# Patient Record
Sex: Male | Born: 1990 | Race: White | Hispanic: No | Marital: Married | Smoking: Current every day smoker
Health system: Southern US, Community
[De-identification: ages and names within clinical notes are randomized; demographics above are authoritative.]

## PROBLEM LIST (undated history)

## (undated) DIAGNOSIS — I10 Essential (primary) hypertension: Secondary | ICD-10-CM

---

## 2014-06-25 ENCOUNTER — Emergency Department: Payer: Self-pay | Admitting: Emergency Medicine

## 2014-06-25 LAB — COMPREHENSIVE METABOLIC PANEL
ALBUMIN: 3.5 g/dL (ref 3.4–5.0)
ANION GAP: 6 — AB (ref 7–16)
Alkaline Phosphatase: 92 U/L
BILIRUBIN TOTAL: 0.2 mg/dL (ref 0.2–1.0)
BUN: 17 mg/dL (ref 7–18)
CREATININE: 0.95 mg/dL (ref 0.60–1.30)
Calcium, Total: 9.1 mg/dL (ref 8.5–10.1)
Chloride: 108 mmol/L — ABNORMAL HIGH (ref 98–107)
Co2: 25 mmol/L (ref 21–32)
EGFR (African American): >60
EGFR (Non-African Amer.): >60
Glucose: 103 mg/dL — ABNORMAL HIGH (ref 65–99)
Osmolality: 279 (ref 275–301)
POTASSIUM: 4 mmol/L (ref 3.5–5.1)
SGOT(AST): 26 U/L (ref 15–37)
SGPT (ALT): 41 U/L (ref 12–78)
Sodium: 139 mmol/L (ref 136–145)
TOTAL PROTEIN: 7.3 g/dL (ref 6.4–8.2)

## 2014-06-25 LAB — CBC
HCT: 44.9 % (ref 40.0–52.0)
HGB: 14.8 g/dL (ref 13.0–18.0)
MCH: 28.7 pg (ref 26.0–34.0)
MCHC: 33.1 g/dL (ref 32.0–36.0)
MCV: 87 fL (ref 80–100)
PLATELETS: 175 10*3/uL (ref 150–440)
RBC: 5.18 10*6/uL (ref 4.40–5.90)
RDW: 13.7 % (ref 11.5–14.5)
WBC: 8.8 10*3/uL (ref 3.8–10.6)

## 2014-06-25 LAB — CK TOTAL AND CKMB (NOT AT ARMC)
CK, Total: 273 U/L
CK-MB: 1.8 ng/mL (ref 0.5–3.6)

## 2014-06-25 LAB — TROPONIN I

## 2015-11-18 ENCOUNTER — Emergency Department
Admission: EM | Admit: 2015-11-18 | Discharge: 2015-11-18 | Disposition: A | Discharge status: Home / Self Care | Payer: Self-pay | Attending: Emergency Medicine | Admitting: Emergency Medicine

## 2015-11-18 DIAGNOSIS — I10 Essential (primary) hypertension: Secondary | ICD-10-CM | POA: Insufficient documentation

## 2015-11-18 DIAGNOSIS — Z880 Allergy status to penicillin: Secondary | ICD-10-CM | POA: Insufficient documentation

## 2015-11-18 DIAGNOSIS — K219 Gastro-esophageal reflux disease without esophagitis: Secondary | ICD-10-CM | POA: Insufficient documentation

## 2015-11-18 DIAGNOSIS — F17200 Nicotine dependence, unspecified, uncomplicated: Secondary | ICD-10-CM | POA: Insufficient documentation

## 2015-11-18 DIAGNOSIS — R42 Dizziness and giddiness: Secondary | ICD-10-CM | POA: Insufficient documentation

## 2015-11-18 HISTORY — DX: Essential (primary) hypertension: I10

## 2015-11-18 LAB — BASIC METABOLIC PANEL
ANION GAP: 4 — AB (ref 5–15)
BUN: 17 mg/dL (ref 6–20)
CALCIUM: 9.9 mg/dL (ref 8.9–10.3)
CO2: 29 mmol/L (ref 22–32)
Chloride: 103 mmol/L (ref 101–111)
Creatinine, Ser: 1.02 mg/dL (ref 0.61–1.24)
Glucose, Bld: 111 mg/dL — ABNORMAL HIGH (ref 65–99)
POTASSIUM: 3.8 mmol/L (ref 3.5–5.1)
Sodium: 136 mmol/L (ref 135–145)

## 2015-11-18 LAB — CBC
HCT: 47.4 % (ref 40.0–52.0)
HEMOGLOBIN: 15.8 g/dL (ref 13.0–18.0)
MCH: 28.8 pg (ref 26.0–34.0)
MCHC: 33.3 g/dL (ref 32.0–36.0)
MCV: 86.3 fL (ref 80.0–100.0)
Platelets: 189 10*3/uL (ref 150–440)
RBC: 5.5 MIL/uL (ref 4.40–5.90)
RDW: 13.7 % (ref 11.5–14.5)
WBC: 8.5 10*3/uL (ref 3.8–10.6)

## 2015-11-18 LAB — URINALYSIS COMPLETE WITH MICROSCOPIC (ARMC ONLY)
Bacteria, UA: NONE SEEN
Bilirubin Urine: NEGATIVE
Glucose, UA: NEGATIVE mg/dL
HGB URINE DIPSTICK: NEGATIVE
KETONES UR: NEGATIVE mg/dL
LEUKOCYTES UA: NEGATIVE
NITRITE: NEGATIVE
PH: 6 (ref 5.0–8.0)
PROTEIN: 100 mg/dL — AB
SPECIFIC GRAVITY, URINE: 1.017 (ref 1.005–1.030)

## 2015-11-18 MED ORDER — PANTOPRAZOLE SODIUM 40 MG PO TBEC: 40.0000 mg | DELAYED_RELEASE_TABLET | Freq: Every day | ORAL | Status: AC

## 2015-11-18 NOTE — ED Notes (Signed)
Pt reports that he has been feeling dizzy and he is sure that it is his blood pressure. He states that he used to take bp meds, but his PCP took him off the meds because he "got better" after losing about 60 pounds. He reports that he has had swelling in his ankles when he wakes up but it goes away after an hour or two. Pt indicates hx of kidney problems in the past, wonders if that could be cause of dizziness and swelling.

## 2015-11-18 NOTE — ED Provider Notes (Signed)
[facility] Emergency Department Provider Note  Time seen: 9:42 AM  I have reviewed the triage vital signs and the nursing notes.   HISTORY  Chief Complaint Dizziness    HPI James Crawford is a 24 y.o. male with a past medical history of hypertension who presents to the emergency department with dizziness. According to the patient for the past several weeks he has been intermittently dizzy and lightheaded. States he had similar symptoms as a child when he is being treated for high blood pressure however he stopped taking this medication years ago. Patient also states occasionally he will have swelling in his hands and feet, but denies any currently. Also states at times he will have a burning feeling in his upper abdomen, especially upon awakening in the morning. But denies any currently. Denies any chest pain now or at any time. Denies any shortness of breath. Denies any focal weakness or numbness. Describes the dizziness as mild to moderate, but gone currently.Patient was concerned that his blood pressure could be high so he came to the emergency department for evaluation. During his laboratory draw the patient did have a brief syncopal episode.     Past Medical History  Diagnosis Date  . Hypertension     There are no active problems to display for this patient.   History reviewed. No pertinent past surgical history.  No current outpatient prescriptions on file.  Allergies Penicillins  History reviewed. No pertinent family history.  Social History Social History  Substance Use Topics  . Smoking status: Current Every Day Smoker  . Smokeless tobacco: None  . Alcohol Use: None    Review of Systems Constitutional: Negative for fever. Positive for intermittent dizziness Cardiovascular: Negative for chest pain. Respiratory: Negative for shortness of breath. Gastrointestinal: Occasional burning sensation in the epigastrium especially in the  mornings. Denies nausea, vomiting, diarrhea. Genitourinary: Negative for dysuria. Musculoskeletal: Negative for back pain. Neurological: Negative for focal weakness or numbness. 10-point ROS otherwise negative.  ____________________________________________   PHYSICAL EXAM:  VITAL SIGNS: ED Triage Vitals  Enc Vitals Group     BP 11/18/15 0837 144/83 mmHg     Pulse Rate 11/18/15 0837 79     Resp 11/18/15 0837 17     Temp 11/18/15 0837 98.2 F (36.8 C)     Temp Source 11/18/15 0837 Oral     SpO2 11/18/15 0837 95 %     Weight 11/18/15 0837 250 lb (113.399 kg)     Height 11/18/15 0837 5\' 11"  (1.803 m)     Head Cir --      Peak Flow --      Pain Score --      Pain Loc --      Pain Edu? --      Excl. in GC? --     Constitutional: Alert and oriented. Well appearing and in no distress. Eyes: Normal exam ENT   Head: Normocephalic and atraumatic.   Mouth/Throat: Mucous membranes are moist. Cardiovascular: Normal rate, regular rhythm. No murmur Respiratory: Normal respiratory effort without tachypnea nor retractions. Breath sounds are clear and equal bilaterally. No wheezes/rales/rhonchi. Gastrointestinal: Soft and nontender. No distention.  Musculoskeletal: Nontender with normal range of motion in all extremities. No lower extremity tenderness or edema. Neurologic:  Normal speech and language. No gross focal neurologic deficits  Skin:  Skin is warm, dry and intact.  Psychiatric: Mood and affect are normal. Speech and behavior are normal.   ____________________________________________    EKG  EKG reviewed and interpreted by myself shows normal sinus rhythm at 65 bpm, narrow QRS, normal axis, normal intervals, no ST changes. Overall normal EKG.  ____________________________________________    INITIAL IMPRESSION / ASSESSMENT AND PLAN / ED COURSE  Pertinent labs & imaging results that were available during my care of the patient were reviewed by me and considered in my  medical decision making (see chart for details).  Patient presents the emergency department with intermittent dizziness. Patient initially mildly hypertensive, however now his blood pressure is 120s/50s. Denies any symptoms at this time. During his laboratory draw the patient did have a brief syncopal episode. We will check labs, EKG, and closely monitor in the emergency department.  EKG within normal limits, blood work within normal limits. Currently awaiting urinalysis results. Patient continues to feel well. Patient's abdominal discomfort symptoms in the morning are most suggestive of gastritis/gastric reflux. We will treat the patient with a proton pump inhibitor.  Labs are within normal limits. We will discharge home with primary care follow-up. Patient is agreeable to plan.  ____________________________________________   FINAL CLINICAL IMPRESSION(S) / ED DIAGNOSES  Gastric reflux Dizziness   Minna Antis, MD 11/18/15 1151

## 2015-11-18 NOTE — Discharge Instructions (Signed)
Dizziness °Dizziness is a common problem. It makes you feel unsteady or lightheaded. You may feel like you are about to pass out (faint). Dizziness can lead to injury if you stumble or fall. Anyone can get dizzy, but dizziness is more common in older adults. This condition can be caused by a number of things, including: °· Medicines. °· Dehydration. °· Illness. °HOME CARE °Following these instructions may help with your condition: °Eating and Drinking °· Drink enough fluid to keep your pee (urine) clear or pale yellow. This helps to keep you from getting dehydrated. Try to drink more clear fluids, such as water. °· Do not drink alcohol. °· Limit how much caffeine you drink or eat if told by your doctor. °· Limit how much salt you drink or eat if told by your doctor. °Activity °· Avoid making quick movements. °¨ When you stand up from sitting in a chair, steady yourself until you feel okay. °¨ In the morning, first sit up on the side of the bed. When you feel okay, stand slowly while you hold onto something. Do this until you know that your balance is fine. °· Move your legs often if you need to stand in one place for a long time. Tighten and relax your muscles in your legs while you are standing. °· Do not drive or use heavy machinery if you feel dizzy. °· Avoid bending down if you feel dizzy. Place items in your home so that they are easy for you to reach without leaning over. °Lifestyle °· Do not use any tobacco products, including cigarettes, chewing tobacco, or electronic cigarettes. If you need help quitting, ask your doctor. °· Try to lower your stress level, such as with yoga or meditation. Talk with your doctor if you need help. °General Instructions °· Watch your dizziness for any changes. °· Take medicines only as told by your doctor. Talk with your doctor if you think that your dizziness is caused by a medicine that you are taking. °· Tell a friend or a family member that you are feeling dizzy. If he or  she notices any changes in your behavior, have this person call your doctor. °· Keep all follow-up visits as told by your doctor. This is important. °GET HELP IF: °· Your dizziness does not go away. °· Your dizziness or light-headedness gets worse. °· You feel sick to your stomach (nauseous). °· You have trouble hearing. °· You have new symptoms. °· You are unsteady on your feet or you feel like the room is spinning. °GET HELP RIGHT AWAY IF: °· You throw up (vomit) or have diarrhea and are unable to eat or drink anything. °· You have trouble: °¨ Talking. °¨ Walking. °¨ Swallowing. °¨ Using your arms, hands, or legs. °· You feel generally weak. °· You are not thinking clearly or you have trouble forming sentences. It may take a friend or family member to notice this. °· You have: °¨ Chest pain. °¨ Pain in your belly (abdomen). °¨ Shortness of breath. °¨ Sweating. °· Your vision changes. °· You are bleeding. °· You have a headache. °· You have neck pain or a stiff neck. °· You have a fever. °  °This information is not intended to replace advice given to you by your health care provider. Make sure you discuss any questions you have with your health care provider. °  °Document Released: 11/12/2011 Document Revised: 04/09/2015 Document Reviewed: 11/19/2014 °Elsevier Interactive Patient Education ©2016 Elsevier Inc. ° °

## 2015-11-18 NOTE — ED Notes (Signed)
Pt c/o feeling lightheaded/dizziness over the past several days.. States he is concerned it is b/p related.. States he had HTN as a child and these sx feel the same.. Pt does not currently take any medication for HTN.. States he did not check b/p at home or a local drug store..James Crawford

## 2017-10-29 ENCOUNTER — Emergency Department
Admission: EM | Admit: 2017-10-29 | Discharge: 2017-10-29 | Disposition: A | Discharge status: Home / Self Care | Payer: Medicaid Other | Attending: Emergency Medicine | Admitting: Emergency Medicine

## 2017-10-29 ENCOUNTER — Encounter: Payer: Self-pay | Admitting: Emergency Medicine

## 2017-10-29 ENCOUNTER — Other Ambulatory Visit: Payer: Self-pay

## 2017-10-29 DIAGNOSIS — F17200 Nicotine dependence, unspecified, uncomplicated: Secondary | ICD-10-CM | POA: Diagnosis not present

## 2017-10-29 DIAGNOSIS — K029 Dental caries, unspecified: Secondary | ICD-10-CM | POA: Diagnosis not present

## 2017-10-29 DIAGNOSIS — I10 Essential (primary) hypertension: Secondary | ICD-10-CM | POA: Insufficient documentation

## 2017-10-29 DIAGNOSIS — K0889 Other specified disorders of teeth and supporting structures: Secondary | ICD-10-CM | POA: Diagnosis present

## 2017-10-29 MED ORDER — CLINDAMYCIN HCL 150 MG PO CAPS: 300.0000 mg | ORAL_CAPSULE | Freq: Four times a day (QID) | ORAL | 0 refills | Status: DC

## 2017-10-29 MED ORDER — TRAMADOL HCL 50 MG PO TABS: 50.0000 mg | ORAL_TABLET | Freq: Four times a day (QID) | ORAL | 0 refills | Status: DC | PRN

## 2017-10-29 NOTE — ED Triage Notes (Signed)
Pt reports upper right jaw pain and swelling for two days. Ambulatory to triage, no apparent distress noted.

## 2017-10-29 NOTE — Discharge Instructions (Signed)
In taking antibiotics as directed. You may also take Tylenol over-the-counter as needed for dental pain. A list of dental clinics is listed on your discharge papers. Begin calling Monday for an appointment. Also the dental clinic at [facility] takes walk-ins and a list of these hours as added to your discharge instructions.  OPTIONS FOR DENTAL FOLLOW UP CARE   Department of Health and Human Services - Local Safety Net Dental Clinics TripDoors.comhttp://www.ncdhhs.gov/dph/oralhealth/services/safetynetclinics.htm   [facility] [phone])  [facility] [phone])  [facility] [phone])  [facility] [phone])  [facility] [phone]) This clinic caters to the indigent population and is on a lottery system. Location: [employer], [address], [address], [place] [facility] Hours: Wednesdays from 6pm - 9pm, patients seen by a lottery system. For dates, call or go to [email or web address] Services: Cleanings, fillings and simple extractions. Payment Options: DENTAL WORK IS FREE OF CHARGE. Bring proof of income or support. Best way to get seen: Arrive at 5:15 pm - this is a lottery, NOT first come/first serve, so arriving earlier will not increase your chances of being seen.     [facility] [phone] Select option 1 for emergencies   Location: [facility], [address], [address], [place] [facility] Hours: No walk-ins accepted - call the day before to schedule an appointment. Check in times are 9:30 am and 1:30 pm. Services: Simple extractions, temporary fillings, pulpectomy/pulp debridement, uncomplicated abscess drainage. Payment Options: PAYMENT IS DUE AT THE TIME OF SERVICE.  Fee is usually $100-200, additional surgical procedures (e.g. abscess drainage) may be extra. Cash, checks, Visa/MasterCard accepted.  Can  file Medicaid if patient is covered for dental - patient should call case worker to check. No discount for [facility] patients. Best way to get seen: MUST call the day before and get onto the schedule. Can usually be seen the next 1-2 days. No walk-ins accepted.     [facility] [phone]   Location: [facility], [address], [place] [facility] Hours: M, W, Th, F 8am or 1:30pm, Tues 9a or 1:30 - first come/first served. Services: Simple extractions, temporary fillings, uncomplicated abscess drainage.  You do not need to be an [place] resident. Payment Options: PAYMENT IS DUE AT THE TIME OF SERVICE. Dental insurance, otherwise sliding scale - bring proof of income or support. Depending on income and treatment needed, cost is usually $50-200. Best way to get seen: Arrive early as it is first come/first served.     [facility] [phone]   Location: [address] Clinic Hours: Mon-Thu 8a-5p Services: Most basic dental services including extractions and fillings. Payment Options: PAYMENT IS DUE AT THE TIME OF SERVICE. Sliding scale, up to 50% off - bring proof if income or support. Medicaid with dental option accepted. Best way to get seen: Call to schedule an appointment, can usually be seen within 2 weeks OR they will try to see walk-ins - show up at 8a or 2p (you may have to wait).     [facility] [phone] [place] RESIDENTS ONLY   Location: [facility], [address], [place], [place] [id number] Clinic Hours: By appointment only. Monday - Thursday 8am-5pm, Friday 8am-12pm Services: Cleanings, fillings, extractions. Payment Options: PAYMENT IS DUE AT THE TIME OF SERVICE. Cash, Visa or MasterCard. Sliding scale - $30 minimum per service. Best way  to get seen: Come in to office, complete packet and make an appointment - need proof  of income or support monies for each household member and proof of [facility] [place] residence. Usually takes about a month to get in.     [facility] [phone]   Location: [address] [facility] Hours: Walk-in Urgent Care Dental Services are offered Monday-Friday mornings only. The numbers of emergencies accepted daily is limited to the number of providers available. Maximum 15 - Mondays, Wednesdays & Thursdays Maximum 10 - Tuesdays & Fridays Services: You do not need to be a [place] [place] resident to be seen for a dental emergency. Emergencies are defined as pain, swelling, abnormal bleeding, or dental trauma. Walkins will receive x-rays if needed. NOTE: Dental cleaning is not an emergency. Payment Options: PAYMENT IS DUE AT THE TIME OF SERVICE. Minimum co-pay is $40.00 for uninsured patients. Minimum co-pay is $3.00 for Medicaid with dental coverage. Dental Insurance is accepted and must be presented at time of visit. Medicare does not cover dental. Forms of payment: Cash, credit card, checks. Best way to get seen: If not previously registered with the clinic, walk-in dental registration begins at 7:15 am and is on a first come/first serve basis. If previously registered with the clinic, call to make an appointment.     [facility] [phone] [place] RESIDENTS ONLY   Location: [address], [place], [place] Clinic Hours: Mon-Thu 10a-2p Services: Extractions only! Payment Options: FREE (donations accepted) - bring proof of income or support Best way to get seen: Call and schedule an appointment OR come at 8am on the 1st Monday of every month (except for holidays) when it is first come/first served.     [facility] [phone]   Location: [address], [place] Clinic Hours: Friday mornings Services, Payment Options, Best way to get seen: Call for info

## 2017-10-29 NOTE — ED Notes (Signed)
Pt complains of dental pain that started 3 days ago. Pt denies any previous tooth issues on Right side but knows of a "Bad tooth" on the left side. Pt face is swollen and pain is 5/10 right now. Awaiting EDP.

## 2017-10-29 NOTE — ED Provider Notes (Signed)
[facility] Emergency Department Provider Note   ____________________________________________   First MD Initiated Contact with Patient 10/29/17 1031     (approximate)  I have reviewed the triage vital signs and the nursing notes.   HISTORY  Chief Complaint Dental Pain   HPI James Crawford is a 26 y.o. male is here  with complaint of dental pain. Patient states for the last 2 days his right upper jaw has been painful and swollen. He has not taken any over-the-counter medication for this. He denies any fever or chills. He is not have a regular dentist.he rates his pain as 7 out of 10.   Past Medical History:  Diagnosis Date  . Hypertension     There are no active problems to display for this patient.   No past surgical history on file.  Prior to Admission medications   Medication Sig Start Date End Date Taking? Authorizing Provider  clindamycin (CLEOCIN) 150 MG capsule Take 2 capsules (300 mg total) by mouth 4 (four) times daily. 10/29/17   Tommi RumpsSummers, Ahijah Devery L, PA-C  pantoprazole (PROTONIX) 40 MG tablet Take 1 tablet (40 mg total) by mouth daily. 11/18/15 11/17/16  Minna AntisPaduchowski, Kevin, MD  traMADol (ULTRAM) 50 MG tablet Take 1 tablet (50 mg total) by mouth every 6 (six) hours as needed. 10/29/17   Tommi RumpsSummers, Waylynn Benefiel L, PA-C    Allergies Penicillins  No family history on file.  Social History Social History   Tobacco Use  . Smoking status: Current Every Day Smoker  Substance Use Topics  . Alcohol use: Not on file  . Drug use: Not on file    Review of Systems Constitutional: No fever/chills Eyes: No visual changes. ENT: No sore throat. positive for dental pain. Cardiovascular: Denies chest pain. Respiratory: Denies shortness of breath. Neurological: Negative for headaches, focal weakness or numbness. ____________________________________________   PHYSICAL EXAM:  VITAL SIGNS: ED Triage Vitals [10/29/17 1016]  Enc Vitals Group   BP (!) 157/74     Pulse Rate 63     Resp 18     Temp 98.2 F (36.8 C)     Temp Source Oral     SpO2 99 %     Weight (!) 330 lb (149.7 kg)     Height 6\' 2"  (1.88 m)     Head Circumference      Peak Flow      Pain Score 7     Pain Loc      Pain Edu?      Excl. in GC?    Constitutional: Alert and oriented. Well appearing and in no acute distress. Eyes: Conjunctivae are normal.  Head: Atraumatic. Mouth/Throat: Mucous membranes are moist.  Oropharynx non-erythematous.  Right upper gums premolar to  molar area are  tender and edematous. No active drainage for abscess located.  Teeth in poor hygiene and repair. Neck: No stridor.   Hematological/Lymphatic/Immunilogical: No cervical lymphadenopathy. Cardiovascular: Normal rate, regular rhythm. Grossly normal heart sounds.  Good peripheral circulation. Respiratory: Normal respiratory effort.  No retractions. Lungs CTAB. Musculoskeletal: moves upper and lower extremities without any difficulty. Normal gait was noted. Neurologic:  Normal speech and language. No gross focal neurologic deficits are appreciated.  Skin:  Skin is warm, dry and intact. Psychiatric: Mood and affect are normal. Speech and behavior are normal.  ____________________________________________   LABS (all labs ordered are listed, but only abnormal results are displayed)  Labs Reviewed - No data to display   PROCEDURES  Procedure(s) performed: None  Procedures  Critical Care performed: No  ____________________________________________   INITIAL IMPRESSION / ASSESSMENT AND PLAN / ED COURSE Patient was given a prescription for clindamycin and tramadol. He was also given a list of dental clinics in the area and encouraged to call to make an appointment or  take advantage of the walk-in clinic at [facility]. We discussed discontinuing smoking.  ____________________________________________   FINAL CLINICAL IMPRESSION(S) / ED DIAGNOSES  Final diagnoses:    Pain due to dental caries     ED Discharge Orders        Ordered    clindamycin (CLEOCIN) 150 MG capsule  4 times daily,   Status:  Discontinued     10/29/17 1054    clindamycin (CLEOCIN) 150 MG capsule  4 times daily     10/29/17 1108    traMADol (ULTRAM) 50 MG tablet  Every 6 hours PRN     10/29/17 1108       Note:  This document was prepared using Dragon voice recognition software and may include unintentional dictation errors.    Tommi RumpsSummers, Florice Hindle L, PA-C 10/29/17 1631    Emily FilbertWilliams, Jonathan E, MD 10/30/17 [phone]

## 2017-12-20 ENCOUNTER — Emergency Department: Payer: Self-pay

## 2017-12-20 ENCOUNTER — Encounter: Payer: Self-pay | Admitting: Emergency Medicine

## 2017-12-20 ENCOUNTER — Other Ambulatory Visit: Payer: Self-pay

## 2017-12-20 ENCOUNTER — Emergency Department
Admission: EM | Admit: 2017-12-20 | Discharge: 2017-12-20 | Disposition: A | Discharge status: Home / Self Care | Payer: Self-pay | Attending: Emergency Medicine | Admitting: Emergency Medicine

## 2017-12-20 DIAGNOSIS — I10 Essential (primary) hypertension: Secondary | ICD-10-CM | POA: Insufficient documentation

## 2017-12-20 DIAGNOSIS — J40 Bronchitis, not specified as acute or chronic: Secondary | ICD-10-CM

## 2017-12-20 DIAGNOSIS — J209 Acute bronchitis, unspecified: Secondary | ICD-10-CM | POA: Insufficient documentation

## 2017-12-20 DIAGNOSIS — F17200 Nicotine dependence, unspecified, uncomplicated: Secondary | ICD-10-CM | POA: Insufficient documentation

## 2017-12-20 MED ORDER — ALBUTEROL SULFATE HFA 108 (90 BASE) MCG/ACT IN AERS: 2.0000 | INHALATION_SPRAY | Freq: Four times a day (QID) | RESPIRATORY_TRACT | 0 refills | Status: DC | PRN

## 2017-12-20 MED ORDER — BENZONATATE 100 MG PO CAPS: 100.0000 mg | ORAL_CAPSULE | Freq: Three times a day (TID) | ORAL | 0 refills | Status: DC | PRN

## 2017-12-20 MED ORDER — PREDNISONE 10 MG PO TABS: ORAL_TABLET | ORAL | 0 refills | Status: DC

## 2017-12-20 MED ORDER — AZITHROMYCIN 250 MG PO TABS: ORAL_TABLET | ORAL | 0 refills | Status: DC

## 2017-12-20 MED ORDER — IPRATROPIUM-ALBUTEROL 0.5-2.5 (3) MG/3ML IN SOLN
3.0000 mL | Freq: Once | RESPIRATORY_TRACT | Status: AC
  Administered 2017-12-20: 3 mL via RESPIRATORY_TRACT
  Filled 2017-12-20: qty 3

## 2017-12-20 NOTE — ED Triage Notes (Signed)
Arrives with c/o coughing and wheezing x 2 days.  Denies fever.

## 2017-12-20 NOTE — ED Provider Notes (Signed)
[facility] Emergency Department Provider Note  ____________________________________________  Time seen: Approximately 12:37 PM  I have reviewed the triage vital signs and the nursing notes.   HISTORY  Chief Complaint Cough and Wheezing    HPI James Crawford is a 27 y.o. male that presents to the emergency department for evaluation of nasal congestion, productive cough with white sputum, wheezing for 2 days.  Wife thinks her husband had fever last night but did not have a thermometer.  Patient smokes a pack of cigarettes per day.  No sick contacts.  No sore throat, CP, nausea, vomiting, abdominal pain.  Past Medical History:  Diagnosis Date  . Hypertension     There are no active problems to display for this patient.   History reviewed. No pertinent surgical history.  Prior to Admission medications   Medication Sig Start Date End Date Taking? Authorizing Provider  albuterol (PROVENTIL HFA;VENTOLIN HFA) 108 (90 Base) MCG/ACT inhaler Inhale 2 puffs into the lungs every 6 (six) hours as needed for wheezing or shortness of breath. 12/20/17   Enid DerryWagner, Vernelle Wisner, PA-C  azithromycin (ZITHROMAX Z-PAK) 250 MG tablet Take 2 tablets (500 mg) on  Day 1,  followed by 1 tablet (250 mg) once daily on Days 2 through 5. 12/20/17   Enid DerryWagner, Carolos Fecher, PA-C  benzonatate (TESSALON PERLES) 100 MG capsule Take 1 capsule (100 mg total) by mouth 3 (three) times daily as needed for cough. 12/20/17 12/20/18  Enid DerryWagner, Lejend Dalby, PA-C  clindamycin (CLEOCIN) 150 MG capsule Take 2 capsules (300 mg total) by mouth 4 (four) times daily. 10/29/17   Tommi RumpsSummers, Rhonda L, PA-C  pantoprazole (PROTONIX) 40 MG tablet Take 1 tablet (40 mg total) by mouth daily. 11/18/15 11/17/16  Minna AntisPaduchowski, Kevin, MD  predniSONE (DELTASONE) 10 MG tablet Take 6 tablets on day 1, take 5 tablets on day 2, take 4 tablets on day 3, take 3 tablets on day 4, take 2 tablets on day 5, take 1 tablet on day 6 12/20/17   Enid DerryWagner, Yishai Rehfeld, PA-C   traMADol (ULTRAM) 50 MG tablet Take 1 tablet (50 mg total) by mouth every 6 (six) hours as needed. 10/29/17   Tommi RumpsSummers, Rhonda L, PA-C    Allergies Penicillins  No family history on file.  Social History Social History   Tobacco Use  . Smoking status: Current Every Day Smoker  . Smokeless tobacco: Never Used  Substance Use Topics  . Alcohol use: Not on file  . Drug use: Not on file     Review of Systems  Constitutional: No chills ENT: Positive for congestion and rhinorrhea. Cardiovascular: No chest pain. Respiratory: Positive for cough.  Gastrointestinal: No abdominal pain.  No nausea, no vomiting.   Musculoskeletal: Negative for musculoskeletal pain. Skin: Negative for rash, abrasions, lacerations, ecchymosis.   ____________________________________________   PHYSICAL EXAM:  VITAL SIGNS: ED Triage Vitals  Enc Vitals Group     BP 12/20/17 1146 (!) 160/94     Pulse Rate 12/20/17 1146 80     Resp 12/20/17 1146 16     Temp 12/20/17 1146 98.5 F (36.9 C)     Temp Source 12/20/17 1146 Oral     SpO2 12/20/17 1146 96 %     Weight 12/20/17 1145 (!) 340 lb (154.2 kg)     Height 12/20/17 1145 6\' 2"  (1.88 m)     Head Circumference --      Peak Flow --      Pain Score 12/20/17 1144 0     Pain  Loc --      Pain Edu? --      Excl. in GC? --      Constitutional: Alert and oriented. Well appearing and in no acute distress. Eyes: Conjunctivae are normal. PERRL. EOMI. No discharge. Head: Atraumatic. ENT: No frontal and maxillary sinus tenderness.      Ears: Tympanic membranes pearly gray. No discharge.      Nose: Mild congestion/rhinnorhea.      Mouth/Throat: Mucous membranes are moist. Oropharynx non-erythematous. Tonsils not enlarged. No exudates. Uvula midline. Neck: No stridor.   Hematological/Lymphatic/Immunilogical: No cervical lymphadenopathy. Cardiovascular: Normal rate, regular rhythm.  Good peripheral circulation. Respiratory: Normal respiratory effort without  tachypnea or retractions. Lungs CTAB. Good air entry to the bases with no decreased or absent breath sounds. Gastrointestinal: Bowel sounds 4 quadrants. Soft and nontender to palpation. No guarding or rigidity. No palpable masses. No distention. Musculoskeletal: Full range of motion to all extremities. No gross deformities appreciated. Neurologic:  Normal speech and language. No gross focal neurologic deficits are appreciated.  Skin:  Skin is warm, dry and intact. No rash noted.   ____________________________________________   LABS (all labs ordered are listed, but only abnormal results are displayed)  Labs Reviewed - No data to display ____________________________________________  EKG   ____________________________________________  RADIOLOGY Lexine Baton, personally viewed and evaluated these images (plain radiographs) as part of my medical decision making, as well as reviewing the written report by the radiologist.  Dg Chest 2 View  Result Date: 12/20/2017 CLINICAL DATA:  Coughing and wheezing for the past 2 days. EXAM: CHEST  2 VIEW COMPARISON:  None. FINDINGS: The heart size and mediastinal contours are within normal limits. Mild central peribronchial thickening. Both lungs are clear. The visualized skeletal structures are unremarkable. IMPRESSION: Mild bronchitic changes, which could reflect smoking or acute bronchitis. No consolidation. Electronically Signed   By: Obie Dredge M.D.   On: 12/20/2017 12:53    ____________________________________________    PROCEDURES  Procedure(s) performed:    Procedures    Medications  ipratropium-albuterol (DUONEB) 0.5-2.5 (3) MG/3ML nebulizer solution 3 mL (3 mLs Nebulization Given 12/20/17 1220)     ____________________________________________   INITIAL IMPRESSION / ASSESSMENT AND PLAN / ED COURSE  Pertinent labs & imaging results that were available during my care of the patient were reviewed by me and considered in  my medical decision making (see chart for details).  Review of the  [place] was performed in accordance of the [place] prior to dispensing any controlled drugs.     Patient's diagnosis is consistent with bronchitis. Vital signs and exam are reassuring.  Chest x-ray consistent with bronchitis.  Patient felt better after DuoNeb.  Patient appears well and is staying well hydrated.  Patient feels comfortable going home. Patient will be discharged home with prescriptions for azithromycin, prednisone, albuterol, Tessalon Perles. Patient is to follow up with PCP as needed or otherwise directed. Patient is given ED precautions to return to the ED for any worsening or new symptoms.     ____________________________________________  FINAL CLINICAL IMPRESSION(S) / ED DIAGNOSES  Final diagnoses:  Bronchitis      NEW MEDICATIONS STARTED DURING THIS VISIT:  ED Discharge Orders        Ordered    azithromycin (ZITHROMAX Z-PAK) 250 MG tablet     12/20/17 1306    predniSONE (DELTASONE) 10 MG tablet     12/20/17 1306    albuterol (PROVENTIL HFA;VENTOLIN HFA) 108 (90 Base) MCG/ACT inhaler  Every 6 hours  PRN     12/20/17 1306    benzonatate (TESSALON PERLES) 100 MG capsule  3 times daily PRN     12/20/17 1306          This chart was dictated using voice recognition software/Dragon. Despite best efforts to proofread, errors can occur which can change the meaning. Any change was purely unintentional.    Enid Derry, PA-C 12/20/17 1524    Jene Every, MD 12/20/17 1540

## 2018-03-21 ENCOUNTER — Other Ambulatory Visit: Payer: Self-pay

## 2018-03-21 ENCOUNTER — Emergency Department
Admission: EM | Admit: 2018-03-21 | Discharge: 2018-03-21 | Disposition: A | Discharge status: Home / Self Care | Payer: Self-pay | Attending: Emergency Medicine | Admitting: Emergency Medicine

## 2018-03-21 ENCOUNTER — Encounter: Payer: Self-pay | Admitting: Emergency Medicine

## 2018-03-21 DIAGNOSIS — I10 Essential (primary) hypertension: Secondary | ICD-10-CM | POA: Insufficient documentation

## 2018-03-21 DIAGNOSIS — J209 Acute bronchitis, unspecified: Secondary | ICD-10-CM | POA: Insufficient documentation

## 2018-03-21 DIAGNOSIS — F17210 Nicotine dependence, cigarettes, uncomplicated: Secondary | ICD-10-CM | POA: Insufficient documentation

## 2018-03-21 MED ORDER — IPRATROPIUM-ALBUTEROL 0.5-2.5 (3) MG/3ML IN SOLN
3.0000 mL | Freq: Once | RESPIRATORY_TRACT | Status: AC
  Administered 2018-03-21: 3 mL via RESPIRATORY_TRACT

## 2018-03-21 MED ORDER — ALBUTEROL SULFATE HFA 108 (90 BASE) MCG/ACT IN AERS: 2.0000 | INHALATION_SPRAY | Freq: Four times a day (QID) | RESPIRATORY_TRACT | 0 refills | Status: AC | PRN

## 2018-03-21 MED ORDER — IPRATROPIUM-ALBUTEROL 0.5-2.5 (3) MG/3ML IN SOLN
RESPIRATORY_TRACT | Status: AC
  Administered 2018-03-21: 3 mL via RESPIRATORY_TRACT
  Filled 2018-03-21: qty 3

## 2018-03-21 NOTE — ED Provider Notes (Signed)
[facility] Emergency Department Provider Note   ____________________________________________   First MD Initiated Contact with Patient 03/21/18 1142     (approximate)  I have reviewed the triage vital signs and the nursing notes.   HISTORY  Chief Complaint Cough   HPI James Crawford is a 27 y.o. male is here with complaint of intermittent cough for several days.  Patient states that he usually gets bronchitis approximately 3 times a year and when he gets a breathing treatment he gets relief.  He denies any fever or chills.  He has had a nonproductive cough.  Patient has been out in pollen which he attributes to this.  Patient does continue to smoke.  Past Medical History:  Diagnosis Date  . Hypertension     There are no active problems to display for this patient.   History reviewed. No pertinent surgical history.  Prior to Admission medications   Medication Sig Start Date End Date Taking? Authorizing Provider  albuterol (PROVENTIL HFA;VENTOLIN HFA) 108 (90 Base) MCG/ACT inhaler Inhale 2 puffs into the lungs every 6 (six) hours as needed for wheezing or shortness of breath. 03/21/18   Tommi Rumps, PA-C  pantoprazole (PROTONIX) 40 MG tablet Take 1 tablet (40 mg total) by mouth daily. 11/18/15 11/17/16  Minna Antis, MD    Allergies Penicillins  No family history on file.  Social History Social History   Tobacco Use  . Smoking status: Current Every Day Smoker  . Smokeless tobacco: Never Used  Substance Use Topics  . Alcohol use: Not on file  . Drug use: Not on file    Review of Systems Constitutional: No fever/chills Eyes: No visual changes. ENT: No sore throat.  Positive mild nasal congestion. Cardiovascular: Denies chest pain. Respiratory: Denies shortness of breath.  Positive cough. Musculoskeletal: Negative for back pain. Neurological: Negative for headaches. ____________________________________________   PHYSICAL  EXAM:  VITAL SIGNS: ED Triage Vitals  Enc Vitals Group     BP 03/21/18 1112 (!) 154/76     Pulse --      Resp 03/21/18 1112 18     Temp 03/21/18 1112 98.7 F (37.1 C)     Temp Source 03/21/18 1112 Oral     SpO2 03/21/18 1112 98 %     Weight 03/21/18 1114 (!) 340 lb (154.2 kg)     Height 03/21/18 1114 6\' 2"  (1.88 m)     Head Circumference --      Peak Flow --      Pain Score --      Pain Loc --      Pain Edu? --      Excl. in GC? --    Constitutional: Alert and oriented. Well appearing and in no acute distress. Eyes: Conjunctivae are normal.  Head: Atraumatic. Nose: Mild congestion/no rhinnorhea.  TMs are dull but no erythema or injection seen. Mouth/Throat: Mucous membranes are moist.  Oropharynx non-erythematous.  Minimal posterior drainage. Neck: No stridor.   Hematological/Lymphatic/Immunilogical: No cervical lymphadenopathy. Cardiovascular: Normal rate, regular rhythm. Grossly normal heart sounds.  Good peripheral circulation. Respiratory: Normal respiratory effort.  No retractions. Lungs CTAB.  Patient is status post 1 nebulizer treatment at this time. Gastrointestinal: Soft and nontender. No distention.  Musculoskeletal: His upper and lower extremities without any difficulty.  Normal gait was noted. Neurologic:  Normal speech and language. No gross focal neurologic deficits are appreciated.  Skin:  Skin is warm, dry and intact. Psychiatric: Mood and affect are normal. Speech and  behavior are normal.  ____________________________________________   LABS (all labs ordered are listed, but only abnormal results are displayed)  Labs Reviewed - No data to display   PROCEDURES  Procedure(s) performed: None  Procedures  Critical Care performed: No  ____________________________________________   INITIAL IMPRESSION / ASSESSMENT AND PLAN / ED COURSE  As part of my medical decision making, I reviewed the following data within the electronic MEDICAL RECORD NUMBER Notes  from prior ED visits and [place] Controlled Substance Database  Patient presents with complaint of cough and history of bronchitis.  He is improved after one nebulizer treatment.  Patient is encouraged to discontinue smoking.  He was given a prescription for albuterol inhaler and encouraged to obtain either Zyrtec or Claritin for allergy symptoms.  He will increase fluids.  Information for follow-up was given to him either [facility] or [facility]  ____________________________________________   FINAL CLINICAL IMPRESSION(S) / ED DIAGNOSES  Final diagnoses:  Acute bronchitis, unspecified organism  Cigarette smoker     ED Discharge Orders        Ordered    albuterol (PROVENTIL HFA;VENTOLIN HFA) 108 (90 Base) MCG/ACT inhaler  Every 6 hours PRN     03/21/18 1200       Note:  This document was prepared using Dragon voice recognition software and may include unintentional dictation errors.    Tommi RumpsSummers, Aydian Dimmick L, PA-C 03/21/18 1205    Jeanmarie PlantMcShane, James A, MD 03/21/18 1447

## 2018-03-21 NOTE — ED Triage Notes (Signed)
Pt states intermittent cough for the past few days, states he gets bronchitis about 3 times a year-asking for breathing treatment, states they always help, has been around a lot of pollen the past few days.

## 2018-03-21 NOTE — ED Notes (Signed)
See triage note  Presents with cough for couple of days  Hx of bronchitis   States the sx's have been worse with all the pollen  Pos. Wheezing noted  Was given SVN treatment in triage  States he feels better

## 2018-03-21 NOTE — Discharge Instructions (Addendum)
Follow-[facility] or [facility]  Begin using albuterol inhaler 2 puffs 4 times a day as needed.  Discontinue smoking.  Increase fluids.

## 2018-03-21 NOTE — ED Notes (Signed)
First Nurse Note:  Patient complaining of SHOB.  Alert and oriented.  Color good.  Speaking in full sentences.  Denies hx of asthma.

## 2018-09-17 ENCOUNTER — Other Ambulatory Visit: Payer: Self-pay

## 2018-09-17 ENCOUNTER — Encounter: Payer: Self-pay | Admitting: Emergency Medicine

## 2018-09-17 ENCOUNTER — Emergency Department
Admission: EM | Admit: 2018-09-17 | Discharge: 2018-09-17 | Disposition: A | Discharge status: Home / Self Care | Payer: Medicaid Other | Attending: Emergency Medicine | Admitting: Emergency Medicine

## 2018-09-17 ENCOUNTER — Emergency Department: Payer: Medicaid Other

## 2018-09-17 DIAGNOSIS — I10 Essential (primary) hypertension: Secondary | ICD-10-CM | POA: Insufficient documentation

## 2018-09-17 DIAGNOSIS — Z720 Tobacco use: Secondary | ICD-10-CM

## 2018-09-17 DIAGNOSIS — F17210 Nicotine dependence, cigarettes, uncomplicated: Secondary | ICD-10-CM | POA: Insufficient documentation

## 2018-09-17 DIAGNOSIS — R002 Palpitations: Secondary | ICD-10-CM | POA: Insufficient documentation

## 2018-09-17 DIAGNOSIS — Z79899 Other long term (current) drug therapy: Secondary | ICD-10-CM | POA: Insufficient documentation

## 2018-09-17 LAB — COMPREHENSIVE METABOLIC PANEL
ALK PHOS: 70 U/L (ref 38–126)
ALT: 51 U/L — AB (ref 0–44)
AST: 31 U/L (ref 15–41)
Albumin: 4.2 g/dL (ref 3.5–5.0)
Anion gap: 9 (ref 5–15)
BUN: 13 mg/dL (ref 6–20)
CALCIUM: 9.4 mg/dL (ref 8.9–10.3)
CO2: 25 mmol/L (ref 22–32)
CREATININE: 1.02 mg/dL (ref 0.61–1.24)
Chloride: 104 mmol/L (ref 98–111)
GFR calc non Af Amer: >60 mL/min (ref >60)
Glucose, Bld: 108 mg/dL — ABNORMAL HIGH (ref 70–99)
Potassium: 3.7 mmol/L (ref 3.5–5.1)
SODIUM: 138 mmol/L (ref 135–145)
Total Bilirubin: 0.6 mg/dL (ref 0.3–1.2)
Total Protein: 7.4 g/dL (ref 6.5–8.1)

## 2018-09-17 LAB — CBC
HCT: 47.4 % (ref 39.0–52.0)
HEMOGLOBIN: 15.9 g/dL (ref 13.0–17.0)
MCH: 29.2 pg (ref 26.0–34.0)
MCHC: 33.5 g/dL (ref 30.0–36.0)
MCV: 87.1 fL (ref 80.0–100.0)
Platelets: 195 10*3/uL (ref 150–400)
RBC: 5.44 MIL/uL (ref 4.22–5.81)
RDW: 13.2 % (ref 11.5–15.5)
WBC: 11.3 10*3/uL — ABNORMAL HIGH (ref 4.0–10.5)
nRBC: 0 % (ref 0.0–0.2)

## 2018-09-17 LAB — TROPONIN I: Troponin I: <0.03 ng/mL (ref <0.03)

## 2018-09-17 NOTE — ED Triage Notes (Signed)
States approx 1 hour ago had L chest pain radiating down L arm. Lasted about 30 minutes. States is relieved now. Denies SOB. Denies fall or injury.

## 2018-09-17 NOTE — ED Provider Notes (Signed)
[facility] Emergency Department Provider Note  ____________________________________________   I have reviewed the triage vital signs and the nursing notes. Where available I have reviewed prior notes and, if possible and indicated, outside hospital notes.    HISTORY  Chief Complaint Chest Pain    HPI Seann Genther is a 27 y.o. male with a history he states of hypertension presents today complaining of "I feel fine".  Patient would like to go home.  He states that he drank a Public librarian drink, had a fluttering sensation for a few minutes and his chest, without any chest pain or shortness of breath, and then he had a discomfort that was in the ulnar aspect of his left wrist of the last for a few minutes as well.  He denies pain shooting down his arm.  It was a very circumscribed area of discomfort about 3 or 4 inches, very narrow, over the left wrist.  No numbness no tingling, no chest pain no shortness of breath, no leg pain no leg swelling no personal family history of PE or DVT, no cough, no diaphoresis, no nausea, no leg swelling, no recent surgery or travel.  Patient states that he feels 100% fine, he thinks he just had a weird reaction to the monster drink and he wants to go home.  He is very disinclined to stay for serial enzymes but he does allow me to keep him here until the first when comes back.    Past Medical History:  Diagnosis Date  . Hypertension     There are no active problems to display for this patient.   History reviewed. No pertinent surgical history.  Prior to Admission medications   Medication Sig Start Date End Date Taking? Authorizing Provider  albuterol (PROVENTIL HFA;VENTOLIN HFA) 108 (90 Base) MCG/ACT inhaler Inhale 2 puffs into the lungs every 6 (six) hours as needed for wheezing or shortness of breath. 03/21/18   Tommi Rumps, PA-C  pantoprazole (PROTONIX) 40 MG tablet Take 1 tablet (40 mg total) by mouth daily. 11/18/15  11/17/16  Minna Antis, MD    Allergies Penicillins  No family history on file.  Social History Social History   Tobacco Use  . Smoking status: Current Every Day Smoker    Packs/day: 1.00    Types: Cigarettes  . Smokeless tobacco: Never Used  Substance Use Topics  . Alcohol use: Not on file  . Drug use: Not on file    Review of Systems Constitutional: No fever/chills Eyes: No visual changes. ENT: No sore throat. No stiff neck no neck pain Cardiovascular: Denies chest pain. Respiratory: Denies shortness of breath. Gastrointestinal:   no vomiting.  No diarrhea.  No constipation. Genitourinary: Negative for dysuria. Musculoskeletal: Negative lower extremity swelling Skin: Negative for rash. Neurological: Negative for severe headaches, focal weakness or numbness.   ____________________________________________   PHYSICAL EXAM:  VITAL SIGNS: ED Triage Vitals  Enc Vitals Group     BP 09/17/18 1616 (!) 163/90     Pulse Rate 09/17/18 1616 79     Resp 09/17/18 1616 18     Temp 09/17/18 1616 98.1 F (36.7 C)     Temp Source 09/17/18 1616 Oral     SpO2 09/17/18 1616 98 %     Weight 09/17/18 1631 (!) 330 lb (149.7 kg)     Height 09/17/18 1631 6\' 2"  (1.88 m)     Head Circumference --      Peak Flow --  Pain Score 09/17/18 1631 0     Pain Loc --      Pain Edu? --      Excl. in GC? --     Constitutional: Alert and oriented. Well appearing and in no acute distress. Eyes: Conjunctivae are normal Head: Atraumatic HEENT: No congestion/rhinnorhea. Mucous membranes are moist.  Oropharynx non-erythematous Neck:   Nontender with no meningismus, no masses, no stridor Cardiovascular: Normal rate, regular rhythm. Grossly normal heart sounds.  Good peripheral circulation. Respiratory: Normal respiratory effort.  No retractions. Lungs CTAB. Abdominal: Soft and nontender. No distention. No guarding no rebound Back:  There is no focal tenderness or step off.  there is no  midline tenderness there are no lesions noted. there is no CVA tenderness Musculoskeletal: No lower extremity tenderness, no upper extremity tenderness. No joint effusions, no DVT signs strong distal pulses no edema Neurologic:  Normal speech and language. No gross focal neurologic deficits are appreciated.  Skin:  Skin is warm, dry and intact. No rash noted. Psychiatric: Mood and affect are normal. Speech and behavior are normal.  ____________________________________________   LABS (all labs ordered are listed, but only abnormal results are displayed)  Labs Reviewed  CBC - Abnormal; Notable for the following components:      Result Value   WBC 11.3 (*)    All other components within normal limits  COMPREHENSIVE METABOLIC PANEL - Abnormal; Notable for the following components:   Glucose, Bld 108 (*)    ALT 51 (*)    All other components within normal limits  TROPONIN I    Pertinent labs  results that were available during my care of the patient were reviewed by me and considered in my medical decision making (see chart for details). ____________________________________________  EKG  I personally interpreted any EKGs ordered by me or triage Sinus rhythm rate 76 bpm no acute ST elevation or depression, normal axis, nonspecific EKG just no acute ischemia  ____________________________________________  RADIOLOGY  Pertinent labs & imaging results that were available during my care of the patient were reviewed by me and considered in my medical decision making (see chart for details). If possible, patient and/or family made aware of any abnormal findings.  Dg Chest 2 View  Result Date: 09/17/2018 CLINICAL DATA:  Chest pain.  Hypertension.  Smoker. EXAM: CHEST - 2 VIEW COMPARISON:  12/20/2017 FINDINGS: Midline trachea.  Normal heart size and mediastinal contours. Sharp costophrenic angles.  No pneumothorax.  Clear lungs. Mild hyperinflation. IMPRESSION: No active cardiopulmonary  disease. Electronically Signed   By: Jeronimo Greaves M.D.   On: 09/17/2018 17:49   ____________________________________________    PROCEDURES  Procedure(s) performed: None  Procedures  Critical Care performed: None  ____________________________________________   INITIAL IMPRESSION / ASSESSMENT AND PLAN / ED COURSE  Pertinent labs & imaging results that were available during my care of the patient were reviewed by me and considered in my medical decision making (see chart for details).  Patient here with a feeling of palpitations was very fleeting after taking large dose of caffeine, and then he noticed the discomfort in the very circumscribed area of his wrist, which is completely gone.  He has no ongoing symptoms.  This is a very atypical story for ACS.  He is a smoker.  I have strongly advised that he stay for serial enzymes and he refuses.  He states his kids are at home.  He and I have my customary discussion, he understands and without further work-up, I cannot  rule out ACS PE dissection or other significant illness although I have very low suspicion for all those entities.  Patient states that he is totally fine without and he wants to go home.  He does have medical decision-making capacity I believe.  His wife is with him and she agrees that they need to get home and take care of their kids.  Given that he refuses further work-up is a very atypical story and has a negative work-up thus far we will discharge him.  However, I did make it very clear to the patient when that he should stop smoking.  I did spend 6 minutes with him on smoking cessation, and I also strongly advised that he stay for further work-up which he declined, and further, I did advise that if he changes his mind or feel worse he is to come back to the emergency room and he understands that.    ____________________________________________   FINAL CLINICAL IMPRESSION(S) / ED DIAGNOSES  Final diagnoses:  None       This chart was dictated using voice recognition software.  Despite best efforts to proofread,  errors can occur which can change meaning.      Jeanmarie Plant, MD 09/17/18 [phone]

## 2018-09-17 NOTE — ED Triage Notes (Signed)
Pt arrives via ems reports some pain to the left side of his chest and some pain in his left forearm, pt reports just sitting, states that he had just finished a monster drink, no distress noted at this time

## 2018-09-17 NOTE — Discharge Instructions (Addendum)
It is not clear exactly what caused her symptoms today.  We have recommended that you stay in the hospital for another couple hours for [place] to figure it out but you need to go home which is certainly your choice but as we indicated it does limit our ability to further evaluate your heart.  For this reason we asked that you be very vigilant with your symptoms and if you have chest pain or shortness of breath or feel worse in any way that you return to the emergency department.  I further advise that you up smoking and follow closely with cardiology and the referral physicians above.  We are always happy to take care of you if you change your mind or feel worse.

## 2019-03-18 IMAGING — CR DG CHEST 2V
1 series · 2 of 2 positions shown · non-contrast
Comparison: None.

CLINICAL DATA: Coughing and wheezing for the past 2 days.

EXAM:
CHEST  2 VIEW

[Series 1: dg chest 2 view · 0.14mm/px · 2 of 2 slices shown]
[im 1/2]
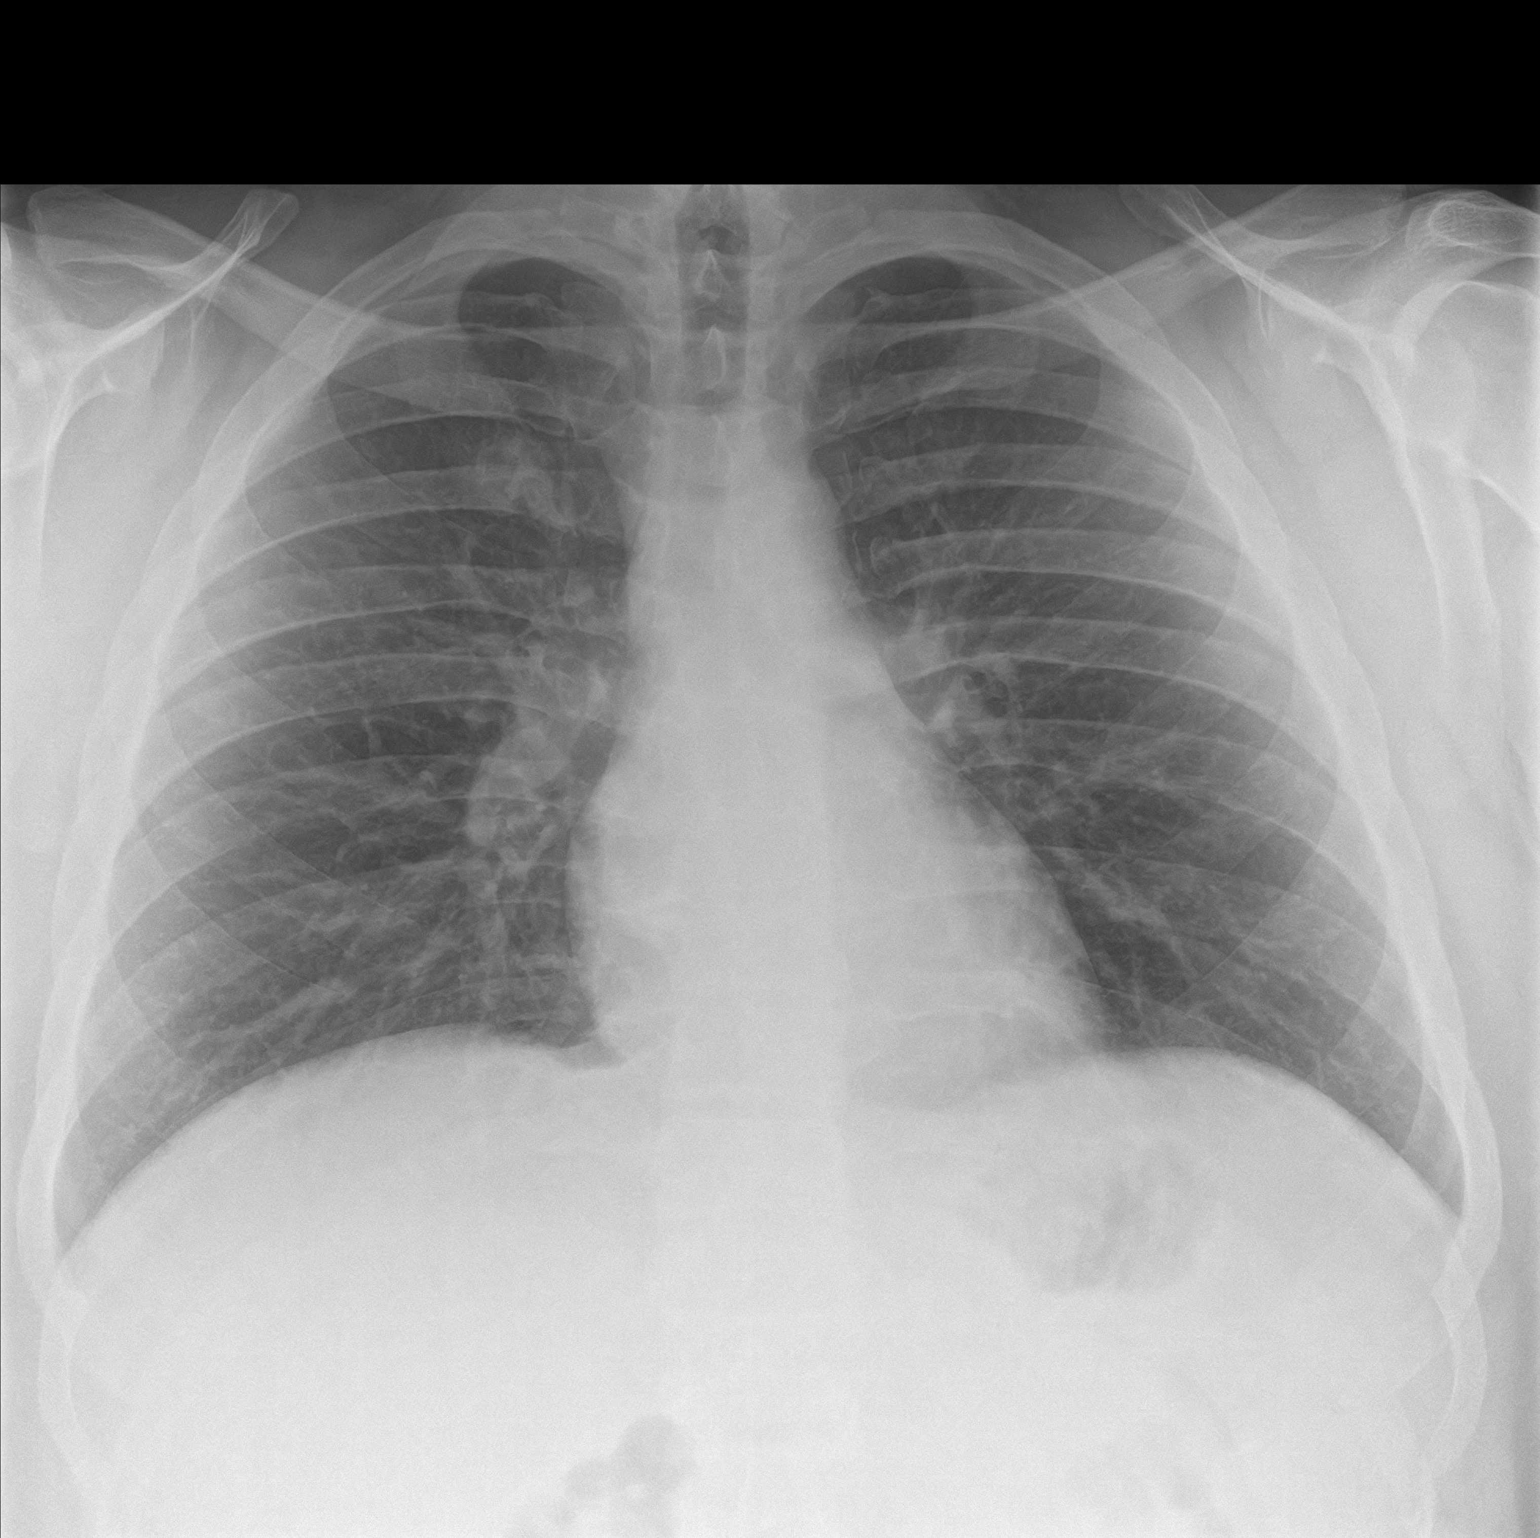
[im 2/2]
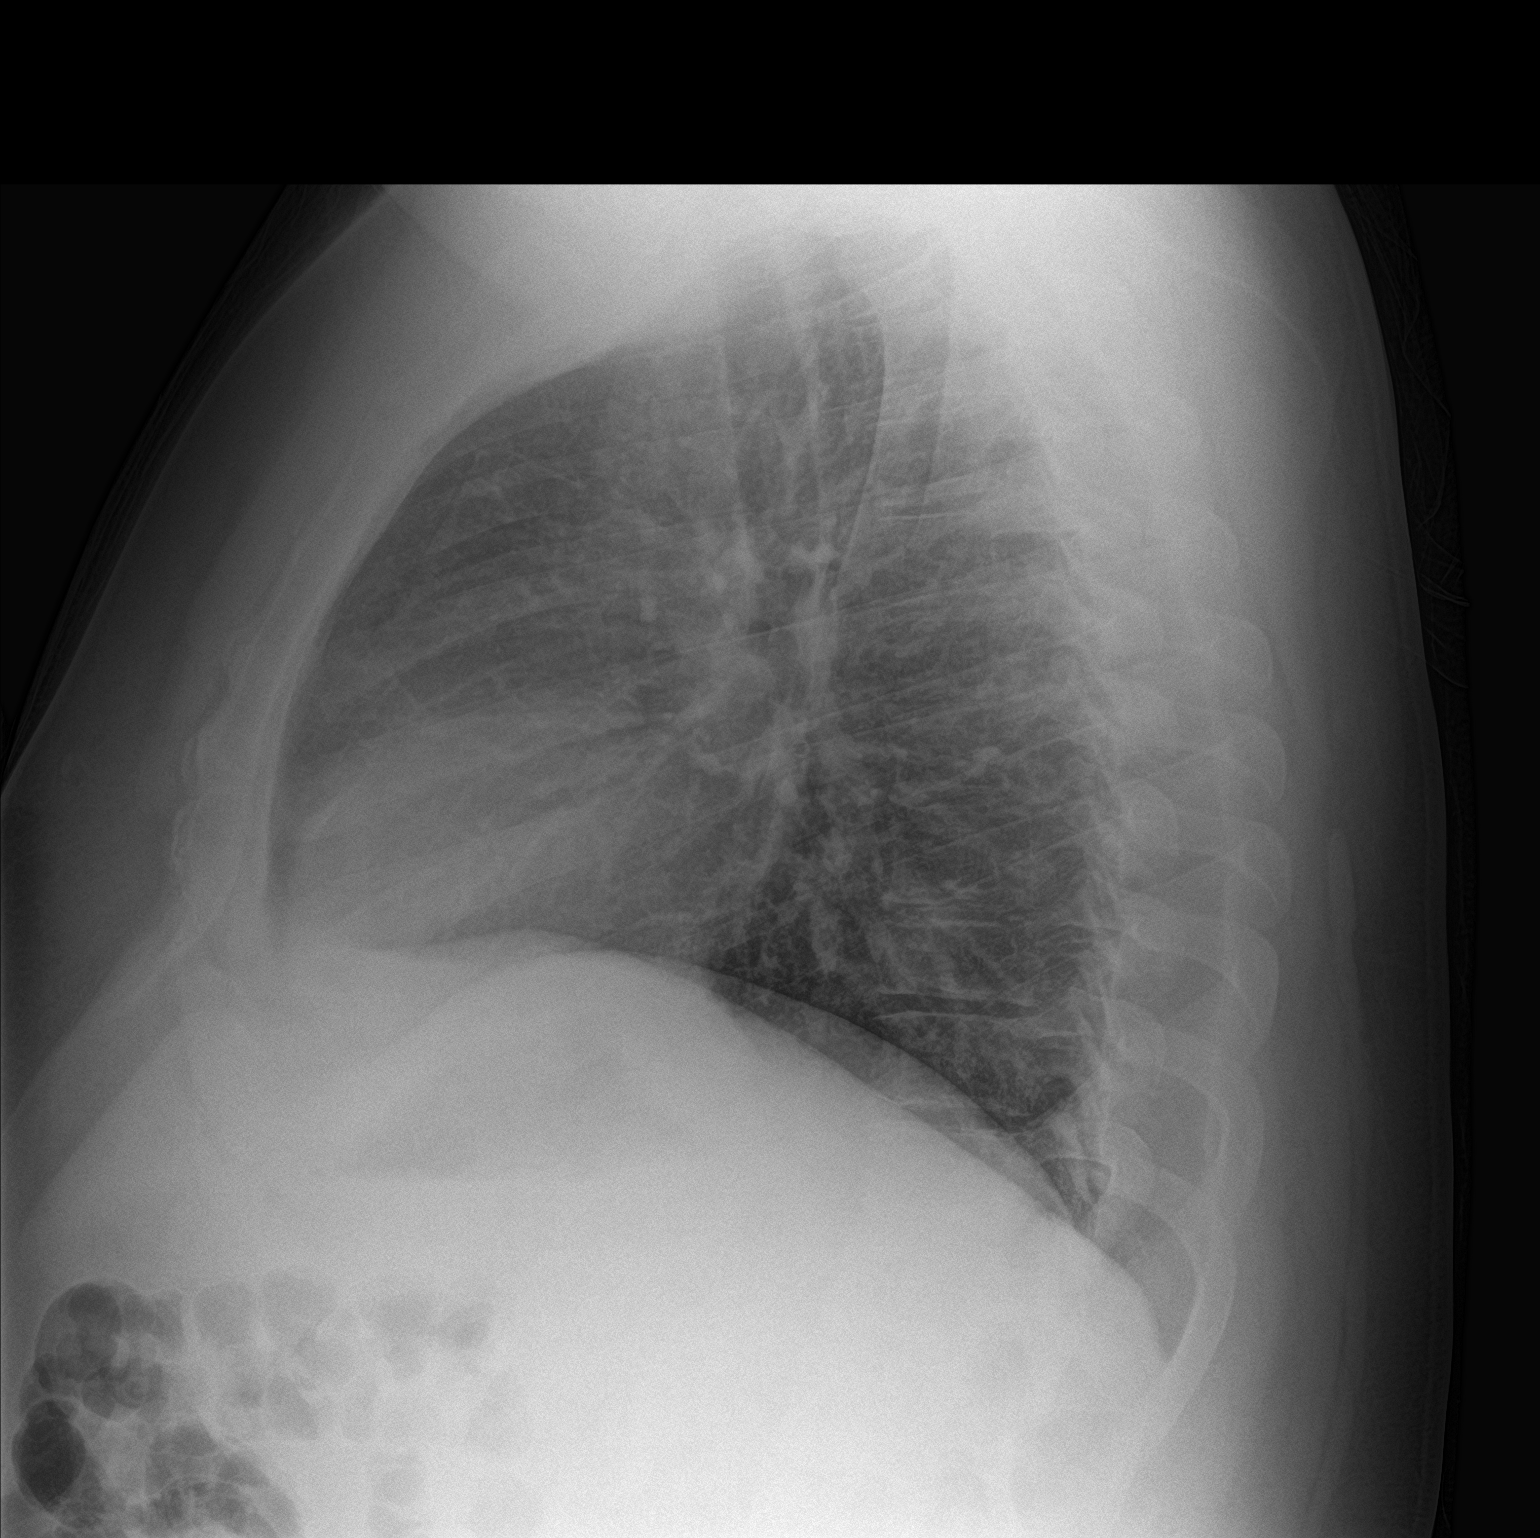

[2 of 2 positions shown; findings below may reference images not displayed]

FINDINGS: The heart size and mediastinal contours are within normal limits.
Mild central peribronchial thickening. Both lungs are clear. The
visualized skeletal structures are unremarkable.
IMPRESSION: Mild bronchitic changes, which could reflect smoking or acute
bronchitis. No consolidation.

## 2019-12-14 IMAGING — CR DG CHEST 2V
1 series · 2 of 2 positions shown · non-contrast
Comparison: 12/20/2017

CLINICAL DATA: Chest pain.  Hypertension.  Smoker.

EXAM:
CHEST - 2 VIEW

[Series 1: dg chest 2 view · 0.14mm/px · 2 of 2 slices shown]
[im 1/2]
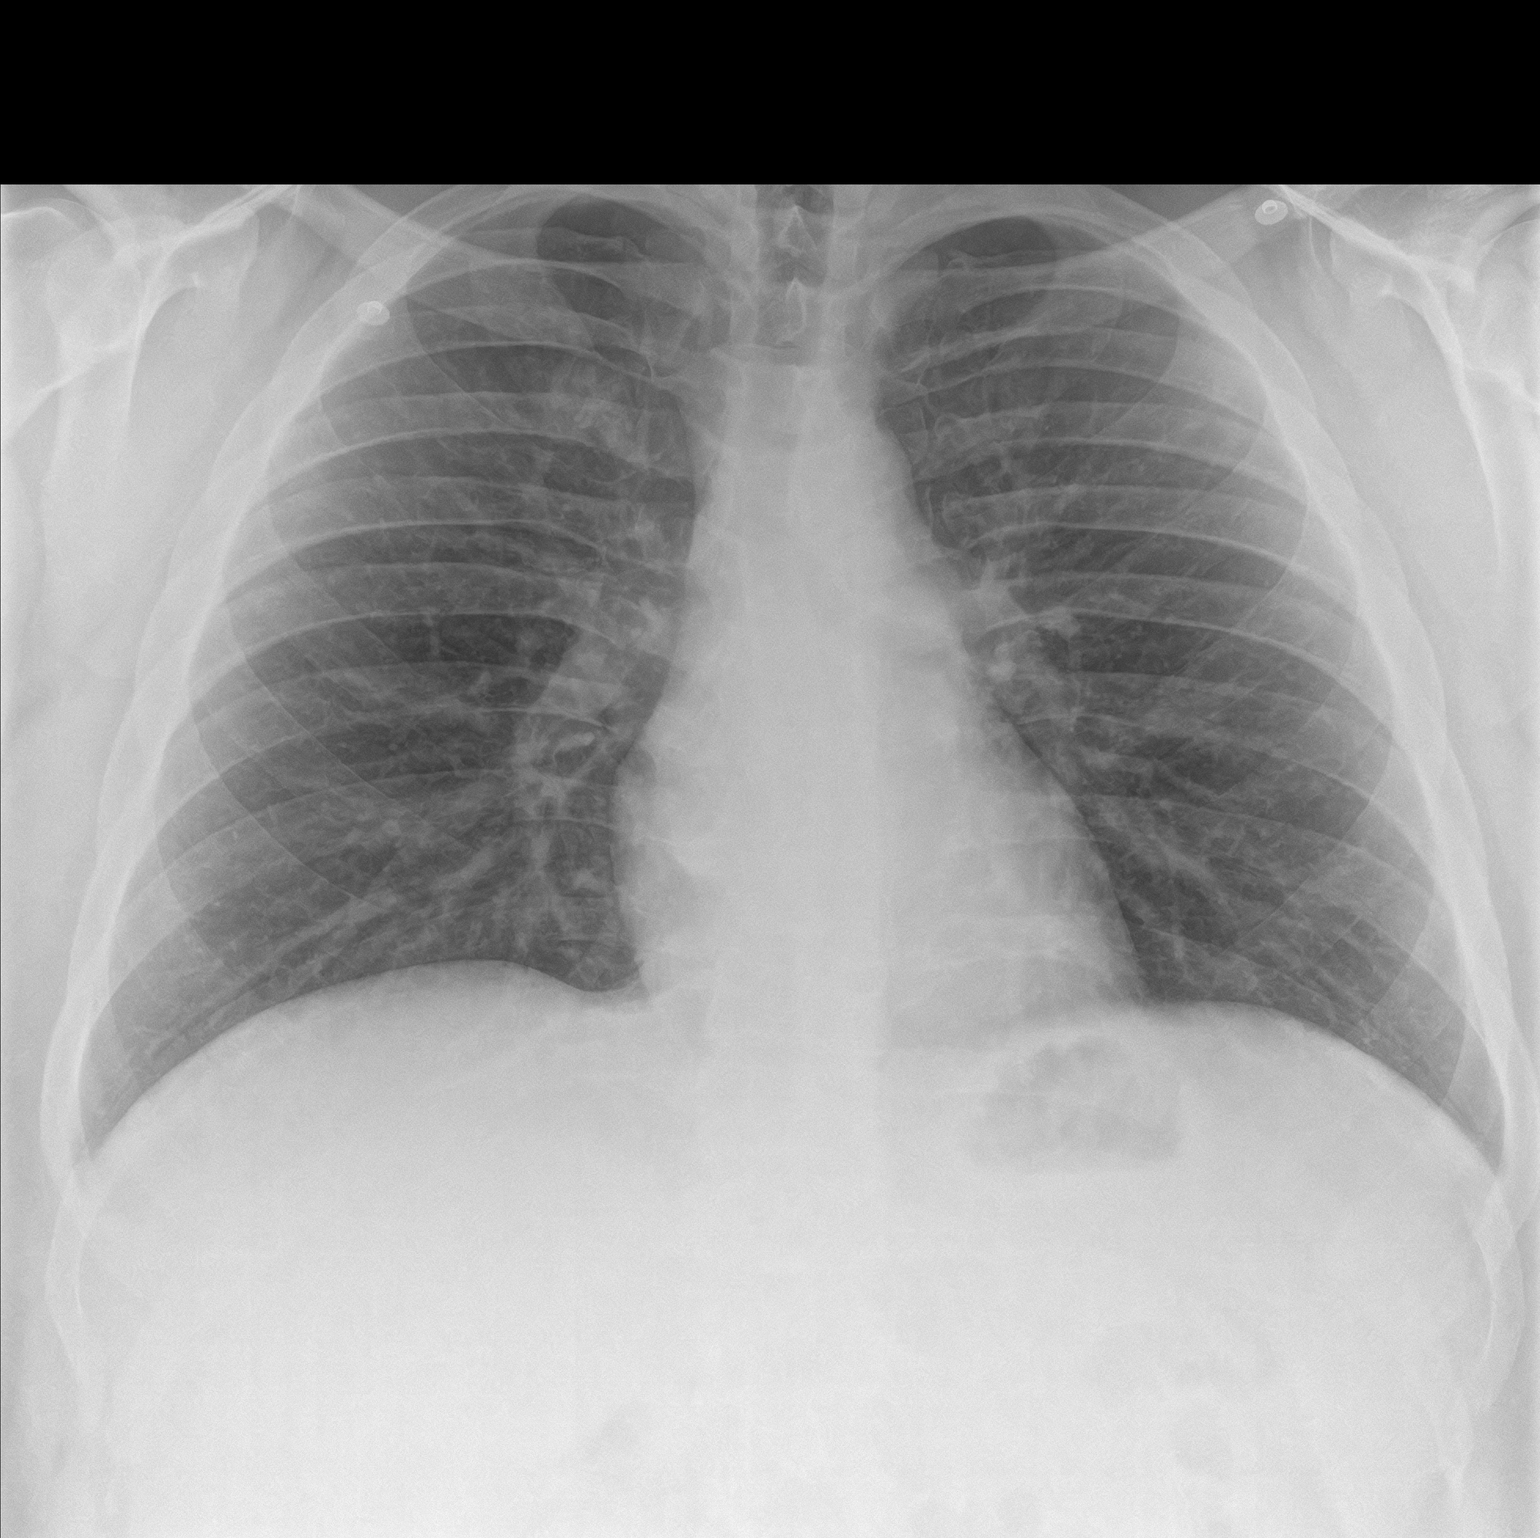
[im 2/2]
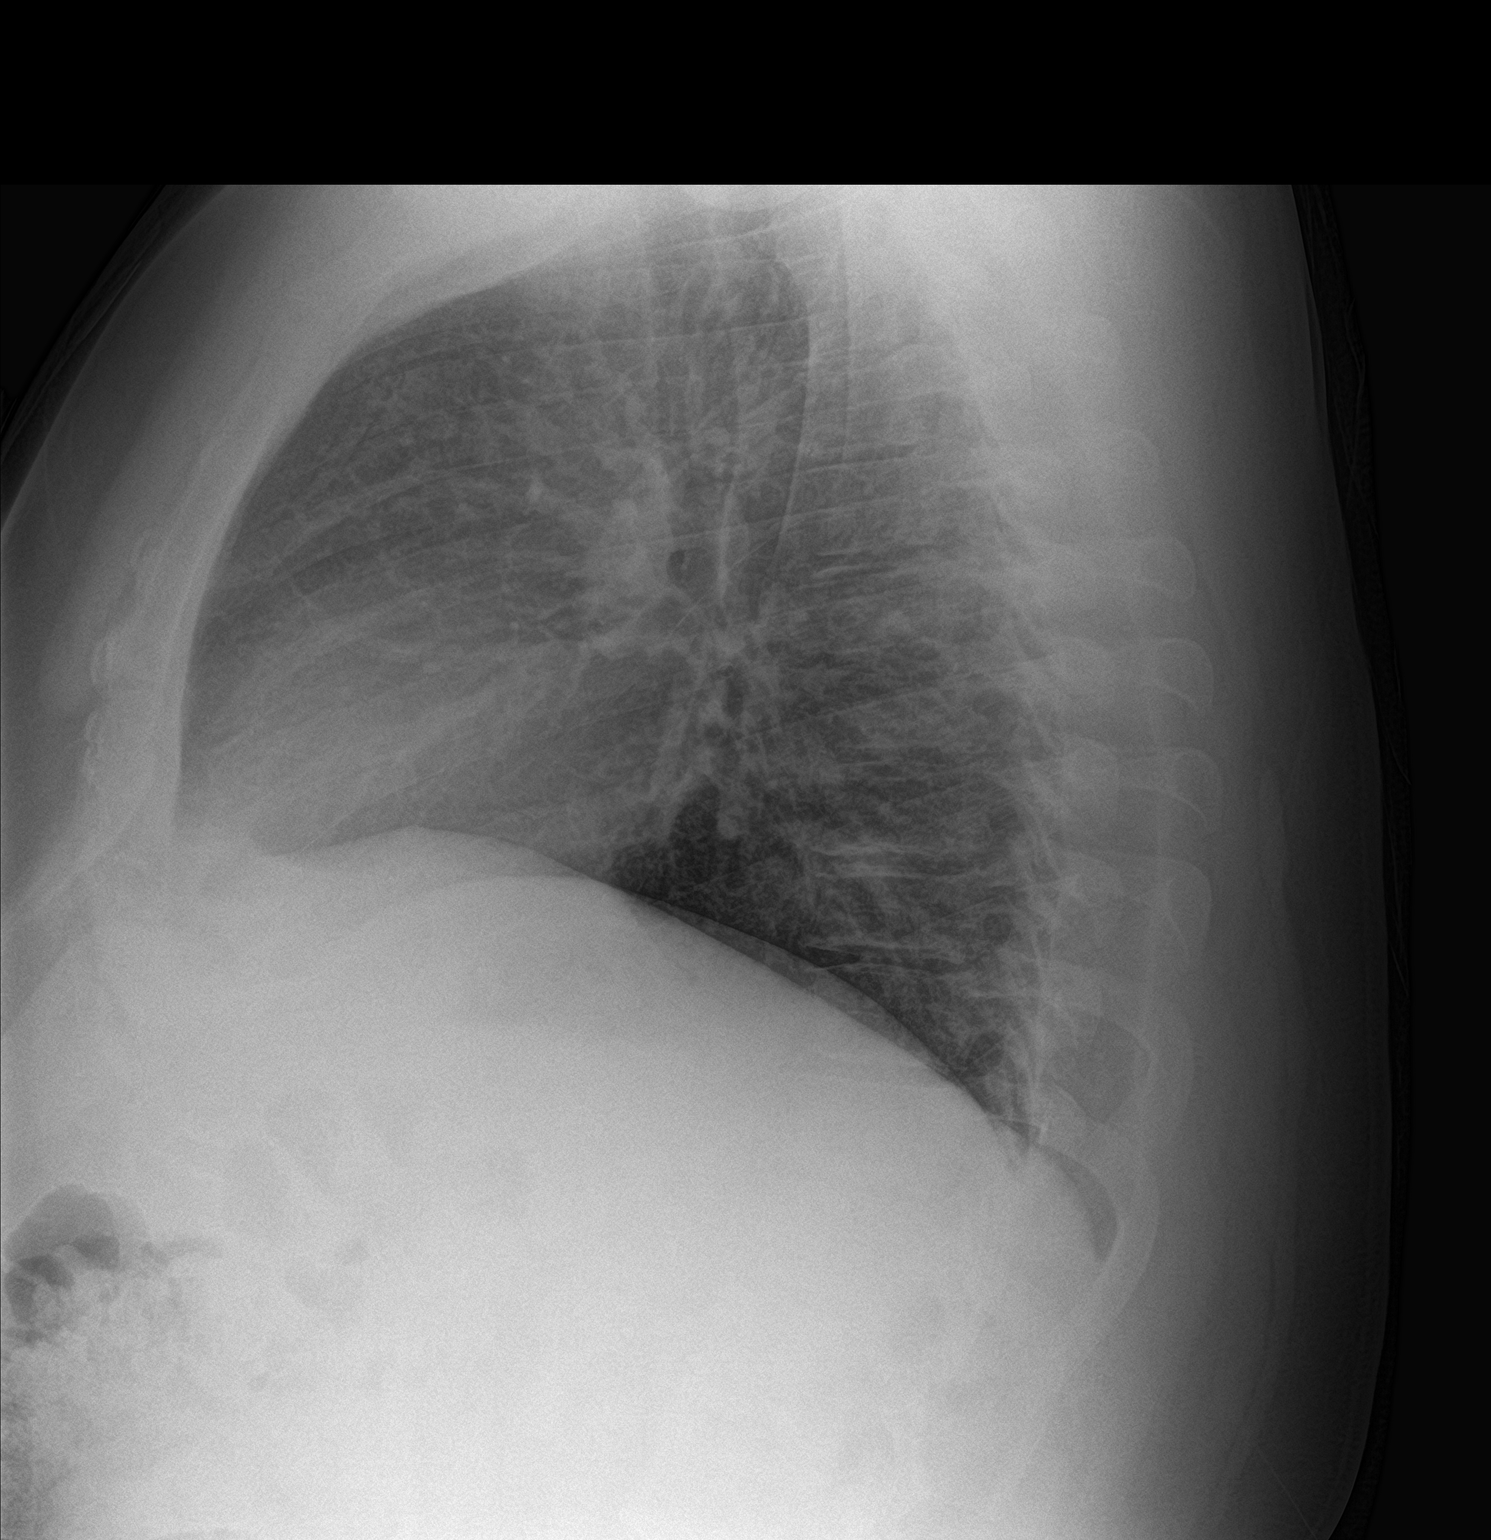

[2 of 2 positions shown; findings below may reference images not displayed]

FINDINGS: Midline trachea.  Normal heart size and mediastinal contours.

Sharp costophrenic angles.  No pneumothorax.  Clear lungs.

Mild hyperinflation.
IMPRESSION: No active cardiopulmonary disease.
